# Patient Record
Sex: Male | Born: 1935 | Race: White | Hispanic: No | Marital: Married
Health system: Southern US, Community
[De-identification: ages and names within clinical notes are randomized; demographics above are authoritative.]

---

## 2012-05-20 ENCOUNTER — Ambulatory Visit: Payer: Self-pay | Admitting: Internal Medicine

## 2012-05-21 LAB — HEPATIC FUNCTION PANEL A (ARMC)
Albumin: 3.7 g/dL (ref 3.4–5.0)
Bilirubin, Direct: 0.1 mg/dL (ref 0.00–0.20)
Bilirubin,Total: 0.5 mg/dL (ref 0.2–1.0)
SGOT(AST): 23 U/L (ref 15–37)
Total Protein: 7.3 g/dL (ref 6.4–8.2)

## 2012-05-21 LAB — CBC CANCER CENTER
Basophil #: 0 x10 3/mm (ref 0.0–0.1)
Eosinophil #: 0.1 x10 3/mm (ref 0.0–0.7)
HCT: 37.9 % — ABNORMAL LOW (ref 40.0–52.0)
HGB: 12.9 g/dL — ABNORMAL LOW (ref 13.0–18.0)
Lymphocyte #: 1.3 x10 3/mm (ref 1.0–3.6)
MCHC: 34 g/dL (ref 32.0–36.0)
MCV: 91 fL (ref 80–100)
Monocyte #: 0.7 x10 3/mm (ref 0.2–1.0)
Monocyte %: 11.9 %
Neutrophil %: 60.7 %
Platelet: 128 x10 3/mm — ABNORMAL LOW (ref 150–440)
RDW: 13.1 % (ref 11.5–14.5)

## 2012-05-21 LAB — IRON AND TIBC: Iron Bind.Cap.(Total): 251 ug/dL (ref 250–450)

## 2012-05-21 LAB — FERRITIN: Ferritin (ARMC): 152 ng/mL (ref 8–388)

## 2012-05-21 LAB — URIC ACID: Uric Acid: 5.7 mg/dL (ref 3.5–7.2)

## 2012-05-24 LAB — URINE IEP, RANDOM

## 2012-05-24 LAB — PROT IMMUNOELECTROPHORES(ARMC)

## 2012-05-24 LAB — KAPPA/LAMBDA FREE LIGHT CHAINS (ARMC)

## 2012-06-05 ENCOUNTER — Ambulatory Visit: Payer: Self-pay | Admitting: Internal Medicine

## 2012-06-24 LAB — CBC CANCER CENTER
Basophil %: 0.7 %
Eosinophil #: 0.1 x10 3/mm (ref 0.0–0.7)
Eosinophil %: 2.1 %
Lymphocyte #: 1.3 x10 3/mm (ref 1.0–3.6)
Lymphocyte %: 23.2 %
MCH: 30.9 pg (ref 26.0–34.0)
MCHC: 33.8 g/dL (ref 32.0–36.0)
MCV: 91 fL (ref 80–100)
Monocyte %: 11.9 %
Platelet: 125 x10 3/mm — ABNORMAL LOW (ref 150–440)
RBC: 4.26 10*6/uL — ABNORMAL LOW (ref 4.40–5.90)
WBC: 5.6 x10 3/mm (ref 3.8–10.6)

## 2012-07-03 ENCOUNTER — Ambulatory Visit: Payer: Self-pay | Admitting: Internal Medicine

## 2012-07-16 ENCOUNTER — Ambulatory Visit: Payer: Self-pay | Admitting: Internal Medicine

## 2012-07-23 LAB — CBC CANCER CENTER
Basophil #: 0 x10 3/mm (ref 0.0–0.1)
Basophil %: 0.8 %
Eosinophil #: 0.1 x10 3/mm (ref 0.0–0.7)
Eosinophil %: 2.6 %
HCT: 41.3 % (ref 40.0–52.0)
Lymphocyte #: 1.3 x10 3/mm (ref 1.0–3.6)
Lymphocyte %: 22.8 %
Monocyte #: 0.7 x10 3/mm (ref 0.2–1.0)
Neutrophil #: 3.5 x10 3/mm (ref 1.4–6.5)
RBC: 4.61 10*6/uL (ref 4.40–5.90)

## 2012-07-23 LAB — RENAL FUNCTION PANEL
BUN: 31 mg/dL — ABNORMAL HIGH (ref 7–18)
Co2: 33 mmol/L — ABNORMAL HIGH (ref 21–32)
EGFR (African American): 28 — ABNORMAL LOW
EGFR (Non-African Amer.): 24 — ABNORMAL LOW
Osmolality: 285 (ref 275–301)
Potassium: 4.3 mmol/L (ref 3.5–5.1)
Sodium: 140 mmol/L (ref 136–145)

## 2012-07-26 LAB — KAPPA/LAMBDA FREE LIGHT CHAINS (ARMC)

## 2012-08-03 ENCOUNTER — Ambulatory Visit: Payer: Self-pay | Admitting: Internal Medicine

## 2012-08-19 LAB — CBC CANCER CENTER
Eosinophil %: 2.3 %
HCT: 41.6 % (ref 40.0–52.0)
Lymphocyte #: 1.3 x10 3/mm (ref 1.0–3.6)
MCH: 30.1 pg (ref 26.0–34.0)
MCHC: 33.9 g/dL (ref 32.0–36.0)
MCV: 89 fL (ref 80–100)
Monocyte #: 0.6 x10 3/mm (ref 0.2–1.0)
Platelet: 152 x10 3/mm (ref 150–440)

## 2012-09-02 ENCOUNTER — Ambulatory Visit: Payer: Self-pay | Admitting: Internal Medicine

## 2012-10-03 ENCOUNTER — Ambulatory Visit: Payer: Self-pay | Admitting: Internal Medicine

## 2012-10-20 LAB — CBC CANCER CENTER
Basophil #: 0.1 x10 3/mm (ref 0.0–0.1)
Basophil %: 1.2 %
HCT: 39.2 % — ABNORMAL LOW (ref 40.0–52.0)
HGB: 13.6 g/dL (ref 13.0–18.0)
Lymphocyte #: 1.3 x10 3/mm (ref 1.0–3.6)
Lymphocyte %: 22.3 %
MCH: 31.4 pg (ref 26.0–34.0)
MCHC: 34.7 g/dL (ref 32.0–36.0)
MCV: 90 fL (ref 80–100)
Monocyte %: 11 %
Neutrophil #: 3.6 x10 3/mm (ref 1.4–6.5)
Platelet: 153 x10 3/mm (ref 150–440)
RDW: 13.5 % (ref 11.5–14.5)

## 2012-10-20 LAB — CREATININE, SERUM
Creatinine: 2.11 mg/dL — ABNORMAL HIGH (ref 0.60–1.30)
EGFR (African American): 31 — ABNORMAL LOW
EGFR (Non-African Amer.): 27 — ABNORMAL LOW

## 2012-10-21 LAB — KAPPA/LAMBDA FREE LIGHT CHAINS (ARMC)

## 2012-11-02 ENCOUNTER — Ambulatory Visit: Payer: Self-pay | Admitting: Internal Medicine

## 2013-01-18 ENCOUNTER — Ambulatory Visit: Payer: Self-pay | Admitting: Internal Medicine

## 2013-01-19 LAB — CBC CANCER CENTER
Basophil #: 0 x10 3/mm (ref 0.0–0.1)
Basophil %: 0.8 %
Eosinophil #: 0.1 x10 3/mm (ref 0.0–0.7)
Lymphocyte #: 1.3 x10 3/mm (ref 1.0–3.6)
MCHC: 33.8 g/dL (ref 32.0–36.0)
MCV: 91 fL (ref 80–100)
Monocyte #: 0.7 x10 3/mm (ref 0.2–1.0)
Neutrophil #: 2.9 x10 3/mm (ref 1.4–6.5)
Neutrophil %: 57 %
RDW: 12.8 % (ref 11.5–14.5)
WBC: 5.1 x10 3/mm (ref 3.8–10.6)

## 2013-01-19 LAB — CREATININE, SERUM
Creatinine: 2.15 mg/dL — ABNORMAL HIGH (ref 0.60–1.30)
EGFR (African American): 30 — ABNORMAL LOW

## 2013-01-21 LAB — KAPPA/LAMBDA FREE LIGHT CHAINS (ARMC)

## 2013-02-02 ENCOUNTER — Ambulatory Visit: Payer: Self-pay | Admitting: Internal Medicine

## 2013-04-20 ENCOUNTER — Ambulatory Visit: Payer: Self-pay | Admitting: Internal Medicine

## 2013-04-20 LAB — CBC CANCER CENTER
Basophil #: 0 x10 3/mm (ref 0.0–0.1)
Basophil %: 0.8 %
Eosinophil #: 0.1 x10 3/mm (ref 0.0–0.7)
HCT: 41.2 % (ref 40.0–52.0)
HGB: 13.8 g/dL (ref 13.0–18.0)
Lymphocyte #: 1.2 x10 3/mm (ref 1.0–3.6)
MCH: 30.5 pg (ref 26.0–34.0)
MCHC: 33.4 g/dL (ref 32.0–36.0)
MCV: 91 fL (ref 80–100)
Monocyte #: 0.7 x10 3/mm (ref 0.2–1.0)
Monocyte %: 12.9 %
Neutrophil #: 3.5 x10 3/mm (ref 1.4–6.5)
Neutrophil %: 62.2 %
WBC: 5.6 x10 3/mm (ref 3.8–10.6)

## 2013-04-20 LAB — CREATININE, SERUM: EGFR (Non-African Amer.): 26 — ABNORMAL LOW

## 2013-04-25 LAB — PROT IMMUNOELECTROPHORES(ARMC)

## 2013-05-05 ENCOUNTER — Ambulatory Visit: Payer: Self-pay | Admitting: Internal Medicine

## 2013-07-21 ENCOUNTER — Emergency Department: Payer: Self-pay | Admitting: Emergency Medicine

## 2013-07-26 ENCOUNTER — Ambulatory Visit: Payer: Self-pay | Admitting: Internal Medicine

## 2013-07-27 LAB — CBC CANCER CENTER
BASOS PCT: 0.6 %
Basophil #: 0 x10 3/mm (ref 0.0–0.1)
EOS ABS: 0.2 x10 3/mm (ref 0.0–0.7)
EOS PCT: 3.4 %
HCT: 42.2 % (ref 40.0–52.0)
HGB: 13.9 g/dL (ref 13.0–18.0)
LYMPHS PCT: 24.5 %
Lymphocyte #: 1.3 x10 3/mm (ref 1.0–3.6)
MCH: 30.2 pg (ref 26.0–34.0)
MCHC: 32.9 g/dL (ref 32.0–36.0)
MCV: 92 fL (ref 80–100)
Monocyte #: 0.7 x10 3/mm (ref 0.2–1.0)
Monocyte %: 12.5 %
Neutrophil #: 3.1 x10 3/mm (ref 1.4–6.5)
Neutrophil %: 59 %
PLATELETS: 168 x10 3/mm (ref 150–440)
RBC: 4.59 10*6/uL (ref 4.40–5.90)
RDW: 13.2 % (ref 11.5–14.5)
WBC: 5.3 x10 3/mm (ref 3.8–10.6)

## 2013-07-27 LAB — CREATININE, SERUM
CREATININE: 2.12 mg/dL — AB (ref 0.60–1.30)
GFR CALC AF AMER: 31 — AB
GFR CALC NON AF AMER: 26 — AB

## 2013-07-28 LAB — KAPPA/LAMBDA FREE LIGHT CHAINS (ARMC)

## 2013-07-28 LAB — PROT IMMUNOELECTROPHORES(ARMC)

## 2013-08-03 ENCOUNTER — Ambulatory Visit: Payer: Self-pay | Admitting: Internal Medicine

## 2013-08-09 ENCOUNTER — Ambulatory Visit: Payer: Self-pay | Admitting: Otolaryngology

## 2013-09-02 ENCOUNTER — Ambulatory Visit: Payer: Self-pay | Admitting: Internal Medicine

## 2013-09-05 ENCOUNTER — Encounter: Payer: Self-pay | Admitting: Neurology

## 2013-10-26 ENCOUNTER — Ambulatory Visit: Payer: Self-pay | Admitting: Internal Medicine

## 2013-10-26 LAB — CREATININE, SERUM
Creatinine: 2.01 mg/dL — ABNORMAL HIGH (ref 0.60–1.30)
EGFR (African American): 33 — ABNORMAL LOW
EGFR (Non-African Amer.): 28 — ABNORMAL LOW

## 2013-10-26 LAB — CBC CANCER CENTER
Basophil #: 0 x10 3/mm (ref 0.0–0.1)
Basophil %: 0.7 %
EOS ABS: 0.1 x10 3/mm (ref 0.0–0.7)
Eosinophil %: 1.2 %
HCT: 39.8 % — AB (ref 40.0–52.0)
HGB: 13.6 g/dL (ref 13.0–18.0)
Lymphocyte #: 1 x10 3/mm (ref 1.0–3.6)
Lymphocyte %: 18.1 %
MCH: 31.1 pg (ref 26.0–34.0)
MCHC: 34.2 g/dL (ref 32.0–36.0)
MCV: 91 fL (ref 80–100)
Monocyte #: 0.6 x10 3/mm (ref 0.2–1.0)
Monocyte %: 10.8 %
Neutrophil #: 3.9 x10 3/mm (ref 1.4–6.5)
Neutrophil %: 69.2 %
Platelet: 159 x10 3/mm (ref 150–440)
RBC: 4.38 10*6/uL — AB (ref 4.40–5.90)
RDW: 13.3 % (ref 11.5–14.5)
WBC: 5.7 x10 3/mm (ref 3.8–10.6)

## 2013-10-28 LAB — PROT IMMUNOELECTROPHORES(ARMC)

## 2013-10-28 LAB — KAPPA/LAMBDA FREE LIGHT CHAINS (ARMC)

## 2013-11-14 LAB — CK TOTAL AND CKMB (NOT AT ARMC)
CK, TOTAL: 90 U/L
CK, Total: 132 U/L
CK-MB: 2.9 ng/mL (ref 0.5–3.6)
CK-MB: 9.2 ng/mL — ABNORMAL HIGH (ref 0.5–3.6)

## 2013-11-14 LAB — URINALYSIS, COMPLETE
BILIRUBIN, UR: NEGATIVE
BLOOD: NEGATIVE
Bacteria: NONE SEEN
GLUCOSE, UR: NEGATIVE mg/dL (ref 0–75)
Ketone: NEGATIVE
LEUKOCYTE ESTERASE: NEGATIVE
Nitrite: NEGATIVE
PROTEIN: NEGATIVE
Ph: 6 (ref 4.5–8.0)
RBC,UR: 1 /HPF (ref 0–5)
SQUAMOUS EPITHELIAL: NONE SEEN
Specific Gravity: 1.011 (ref 1.003–1.030)

## 2013-11-14 LAB — COMPREHENSIVE METABOLIC PANEL
ALK PHOS: 42 U/L — AB
ALT: 17 U/L (ref 12–78)
Albumin: 3.4 g/dL (ref 3.4–5.0)
Anion Gap: 6 — ABNORMAL LOW (ref 7–16)
BUN: 30 mg/dL — ABNORMAL HIGH (ref 7–18)
Bilirubin,Total: 0.3 mg/dL (ref 0.2–1.0)
CHLORIDE: 104 mmol/L (ref 98–107)
CO2: 29 mmol/L (ref 21–32)
Calcium, Total: 8.6 mg/dL (ref 8.5–10.1)
Creatinine: 2.2 mg/dL — ABNORMAL HIGH (ref 0.60–1.30)
EGFR (Non-African Amer.): 25 — ABNORMAL LOW
GFR CALC AF AMER: 29 — AB
GLUCOSE: 77 mg/dL (ref 65–99)
Osmolality: 283 (ref 275–301)
POTASSIUM: 3.8 mmol/L (ref 3.5–5.1)
SGOT(AST): 20 U/L (ref 15–37)
SODIUM: 139 mmol/L (ref 136–145)
Total Protein: 7 g/dL (ref 6.4–8.2)

## 2013-11-14 LAB — CBC WITH DIFFERENTIAL/PLATELET
Basophil #: 0 10*3/uL (ref 0.0–0.1)
Basophil %: 0.5 %
Eosinophil #: 0.1 10*3/uL (ref 0.0–0.7)
Eosinophil %: 1.4 %
HCT: 39.3 % — ABNORMAL LOW (ref 40.0–52.0)
HGB: 12.9 g/dL — AB (ref 13.0–18.0)
LYMPHS ABS: 1.3 10*3/uL (ref 1.0–3.6)
LYMPHS PCT: 25.1 %
MCH: 30.9 pg (ref 26.0–34.0)
MCHC: 33 g/dL (ref 32.0–36.0)
MCV: 94 fL (ref 80–100)
Monocyte #: 0.7 x10 3/mm (ref 0.2–1.0)
Monocyte %: 13.2 %
Neutrophil #: 3.1 10*3/uL (ref 1.4–6.5)
Neutrophil %: 59.8 %
PLATELETS: 153 10*3/uL (ref 150–440)
RBC: 4.19 10*6/uL — ABNORMAL LOW (ref 4.40–5.90)
RDW: 13.3 % (ref 11.5–14.5)
WBC: 5.1 10*3/uL (ref 3.8–10.6)

## 2013-11-14 LAB — TROPONIN I
TROPONIN-I: 0.11 ng/mL — AB
TROPONIN-I: 3.9 ng/mL — AB

## 2013-11-15 ENCOUNTER — Inpatient Hospital Stay: Payer: Self-pay | Admitting: Family Medicine

## 2013-11-15 LAB — PROTIME-INR
INR: 1.1
Prothrombin Time: 13.9 secs (ref 11.5–14.7)

## 2013-11-15 LAB — BASIC METABOLIC PANEL
ANION GAP: 6 — AB (ref 7–16)
BUN: 28 mg/dL — AB (ref 7–18)
CHLORIDE: 111 mmol/L — AB (ref 98–107)
Calcium, Total: 8.3 mg/dL — ABNORMAL LOW (ref 8.5–10.1)
Co2: 24 mmol/L (ref 21–32)
Creatinine: 1.85 mg/dL — ABNORMAL HIGH (ref 0.60–1.30)
EGFR (African American): 36 — ABNORMAL LOW
GFR CALC NON AF AMER: 31 — AB
Glucose: 92 mg/dL (ref 65–99)
OSMOLALITY: 286 (ref 275–301)
Potassium: 4.1 mmol/L (ref 3.5–5.1)
SODIUM: 141 mmol/L (ref 136–145)

## 2013-11-15 LAB — TROPONIN I: TROPONIN-I: 5.1 ng/mL — AB

## 2013-11-15 LAB — CK TOTAL AND CKMB (NOT AT ARMC)
CK, Total: 115 U/L
CK-MB: 9.8 ng/mL — AB (ref 0.5–3.6)

## 2013-11-15 LAB — CBC WITH DIFFERENTIAL/PLATELET
BASOS ABS: 0 10*3/uL (ref 0.0–0.1)
Basophil %: 0.9 %
Eosinophil #: 0.1 10*3/uL (ref 0.0–0.7)
Eosinophil %: 1.9 %
HCT: 37 % — ABNORMAL LOW (ref 40.0–52.0)
HGB: 12.2 g/dL — ABNORMAL LOW (ref 13.0–18.0)
LYMPHS ABS: 1.6 10*3/uL (ref 1.0–3.6)
LYMPHS PCT: 29.2 %
MCH: 30.9 pg (ref 26.0–34.0)
MCHC: 32.9 g/dL (ref 32.0–36.0)
MCV: 94 fL (ref 80–100)
Monocyte #: 0.6 x10 3/mm (ref 0.2–1.0)
Monocyte %: 10.7 %
Neutrophil #: 3.1 10*3/uL (ref 1.4–6.5)
Neutrophil %: 57.3 %
PLATELETS: 140 10*3/uL — AB (ref 150–440)
RBC: 3.93 10*6/uL — ABNORMAL LOW (ref 4.40–5.90)
RDW: 13.3 % (ref 11.5–14.5)
WBC: 5.4 10*3/uL (ref 3.8–10.6)

## 2013-11-15 LAB — APTT: Activated PTT: >160 secs (ref 23.6–35.9)

## 2013-11-15 LAB — CK-MB: CK-MB: 8.5 ng/mL — ABNORMAL HIGH (ref 0.5–3.6)

## 2013-11-15 LAB — HEPARIN LEVEL (UNFRACTIONATED): Anti-Xa(Unfractionated): 0.61 IU/mL (ref 0.30–0.70)

## 2013-11-16 LAB — CBC WITH DIFFERENTIAL/PLATELET
Basophil #: 0 10*3/uL (ref 0.0–0.1)
Basophil %: 0.7 %
Eosinophil #: 0.2 10*3/uL (ref 0.0–0.7)
Eosinophil %: 2.9 %
HCT: 37.5 % — ABNORMAL LOW (ref 40.0–52.0)
HGB: 12.6 g/dL — AB (ref 13.0–18.0)
LYMPHS ABS: 1.6 10*3/uL (ref 1.0–3.6)
Lymphocyte %: 27.1 %
MCH: 31.3 pg (ref 26.0–34.0)
MCHC: 33.7 g/dL (ref 32.0–36.0)
MCV: 93 fL (ref 80–100)
Monocyte #: 0.7 x10 3/mm (ref 0.2–1.0)
Monocyte %: 11.4 %
NEUTROS ABS: 3.4 10*3/uL (ref 1.4–6.5)
Neutrophil %: 57.9 %
Platelet: 147 10*3/uL — ABNORMAL LOW (ref 150–440)
RBC: 4.03 10*6/uL — ABNORMAL LOW (ref 4.40–5.90)
RDW: 13 % (ref 11.5–14.5)
WBC: 5.9 10*3/uL (ref 3.8–10.6)

## 2013-11-16 LAB — HEPARIN LEVEL (UNFRACTIONATED): Anti-Xa(Unfractionated): 0.21 IU/mL — ABNORMAL LOW (ref 0.30–0.70)

## 2013-11-19 ENCOUNTER — Emergency Department: Payer: Self-pay | Admitting: Emergency Medicine

## 2013-11-19 LAB — URINALYSIS, COMPLETE
Bilirubin,UR: NEGATIVE
GLUCOSE, UR: NEGATIVE mg/dL (ref 0–75)
KETONE: NEGATIVE
Leukocyte Esterase: NEGATIVE
Nitrite: NEGATIVE
Ph: 7 (ref 4.5–8.0)
Protein: NEGATIVE
SPECIFIC GRAVITY: 1.012 (ref 1.003–1.030)
SQUAMOUS EPITHELIAL: NONE SEEN
WBC UR: NONE SEEN /HPF (ref 0–5)

## 2013-11-19 LAB — CBC WITH DIFFERENTIAL/PLATELET
BASOS PCT: 0.6 %
Basophil #: 0 10*3/uL (ref 0.0–0.1)
EOS ABS: 0.1 10*3/uL (ref 0.0–0.7)
Eosinophil %: 1 %
HCT: 39.6 % — AB (ref 40.0–52.0)
HGB: 13.2 g/dL (ref 13.0–18.0)
LYMPHS PCT: 19.1 %
Lymphocyte #: 1.5 10*3/uL (ref 1.0–3.6)
MCH: 31.1 pg (ref 26.0–34.0)
MCHC: 33.3 g/dL (ref 32.0–36.0)
MCV: 93 fL (ref 80–100)
Monocyte #: 0.9 x10 3/mm (ref 0.2–1.0)
Monocyte %: 11.1 %
Neutrophil #: 5.5 10*3/uL (ref 1.4–6.5)
Neutrophil %: 68.2 %
Platelet: 167 10*3/uL (ref 150–440)
RBC: 4.24 10*6/uL — ABNORMAL LOW (ref 4.40–5.90)
RDW: 13.1 % (ref 11.5–14.5)
WBC: 8 10*3/uL (ref 3.8–10.6)

## 2013-11-19 LAB — COMPREHENSIVE METABOLIC PANEL
ALT: 26 U/L (ref 12–78)
AST: 29 U/L (ref 15–37)
Albumin: 3.7 g/dL (ref 3.4–5.0)
Alkaline Phosphatase: 41 U/L — ABNORMAL LOW
Anion Gap: 7 (ref 7–16)
BILIRUBIN TOTAL: 0.3 mg/dL (ref 0.2–1.0)
BUN: 33 mg/dL — ABNORMAL HIGH (ref 7–18)
CO2: 27 mmol/L (ref 21–32)
Calcium, Total: 9 mg/dL (ref 8.5–10.1)
Chloride: 99 mmol/L (ref 98–107)
Creatinine: 2.12 mg/dL — ABNORMAL HIGH (ref 0.60–1.30)
EGFR (African American): 31 — ABNORMAL LOW
GFR CALC NON AF AMER: 26 — AB
GLUCOSE: 101 mg/dL — AB (ref 65–99)
Osmolality: 274 (ref 275–301)
Potassium: 4.4 mmol/L (ref 3.5–5.1)
SODIUM: 133 mmol/L — AB (ref 136–145)
TOTAL PROTEIN: 7.5 g/dL (ref 6.4–8.2)

## 2013-11-21 ENCOUNTER — Ambulatory Visit: Payer: Self-pay | Admitting: Internal Medicine

## 2013-11-25 LAB — CULTURE, BLOOD (SINGLE)

## 2014-01-27 ENCOUNTER — Ambulatory Visit: Payer: Self-pay | Admitting: Internal Medicine

## 2014-01-27 LAB — CREATININE, SERUM
CREATININE: 2.35 mg/dL — AB (ref 0.60–1.30)
EGFR (Non-African Amer.): 28 — ABNORMAL LOW
GFR CALC AF AMER: 34 — AB

## 2014-02-02 ENCOUNTER — Ambulatory Visit: Payer: Self-pay | Admitting: Internal Medicine

## 2014-02-02 LAB — PROT IMMUNOELECTROPHORES(ARMC)

## 2014-02-02 LAB — KAPPA/LAMBDA FREE LIGHT CHAINS (ARMC)

## 2014-04-26 ENCOUNTER — Ambulatory Visit: Payer: Self-pay | Admitting: Internal Medicine

## 2014-04-26 LAB — CBC CANCER CENTER
BASOS ABS: 0 x10 3/mm (ref 0.0–0.1)
BASOS PCT: 0.8 %
Eosinophil #: 0.1 x10 3/mm (ref 0.0–0.7)
Eosinophil %: 1.7 %
HCT: 39.2 % — ABNORMAL LOW (ref 40.0–52.0)
HGB: 13 g/dL (ref 13.0–18.0)
LYMPHS ABS: 1 x10 3/mm (ref 1.0–3.6)
Lymphocyte %: 16.8 %
MCH: 30.4 pg (ref 26.0–34.0)
MCHC: 33.3 g/dL (ref 32.0–36.0)
MCV: 91 fL (ref 80–100)
MONO ABS: 0.6 x10 3/mm (ref 0.2–1.0)
Monocyte %: 10.7 %
NEUTROS ABS: 4.2 x10 3/mm (ref 1.4–6.5)
NEUTROS PCT: 70 %
Platelet: 169 x10 3/mm (ref 150–440)
RBC: 4.29 10*6/uL — ABNORMAL LOW (ref 4.40–5.90)
RDW: 13.1 % (ref 11.5–14.5)
WBC: 5.9 x10 3/mm (ref 3.8–10.6)

## 2014-04-26 LAB — CALCIUM: Calcium, Total: 9.1 mg/dL (ref 8.5–10.1)

## 2014-04-27 LAB — PROT IMMUNOELECTROPHORES(ARMC)

## 2014-04-27 LAB — KAPPA/LAMBDA FREE LIGHT CHAINS (ARMC)

## 2014-05-05 ENCOUNTER — Ambulatory Visit: Payer: Self-pay | Admitting: Internal Medicine

## 2014-06-28 ENCOUNTER — Ambulatory Visit: Payer: Self-pay | Admitting: Internal Medicine

## 2014-07-04 ENCOUNTER — Ambulatory Visit
Admit: 2014-07-04 | Disposition: A | Discharge status: Home / Self Care | Payer: Self-pay | Attending: Internal Medicine | Admitting: Internal Medicine

## 2014-08-17 ENCOUNTER — Emergency Department
Admit: 2014-08-17 | Disposition: A | Discharge status: Other Acute Inpatient Hospital | Payer: Self-pay | Admitting: Emergency Medicine

## 2014-08-17 LAB — CK TOTAL AND CKMB (NOT AT ARMC)
CK, TOTAL: 88 U/L
CK-MB: 3.6 ng/mL

## 2014-08-17 LAB — COMPREHENSIVE METABOLIC PANEL
ALBUMIN: 4.1 g/dL
ALK PHOS: 32 U/L — AB
ALT: 12 U/L — AB
Anion Gap: 10 (ref 7–16)
BILIRUBIN TOTAL: 0.8 mg/dL
BUN: 30 mg/dL — AB
CO2: 24 mmol/L
Calcium, Total: 9.1 mg/dL
Chloride: 96 mmol/L — ABNORMAL LOW
Creatinine: 1.95 mg/dL — ABNORMAL HIGH
EGFR (African American): 34 — ABNORMAL LOW
EGFR (Non-African Amer.): 29 — ABNORMAL LOW
GLUCOSE: 153 mg/dL — AB
Potassium: 4.1 mmol/L
SGOT(AST): 31 U/L
Sodium: 130 mmol/L — ABNORMAL LOW
Total Protein: 7 g/dL

## 2014-08-17 LAB — URINALYSIS, COMPLETE
Bacteria: NONE SEEN
Bilirubin,UR: NEGATIVE
Blood: NEGATIVE
Glucose,UR: NEGATIVE mg/dL (ref 0–75)
Ketone: NEGATIVE
LEUKOCYTE ESTERASE: NEGATIVE
NITRITE: NEGATIVE
PH: 7 (ref 4.5–8.0)
RBC, UR: NONE SEEN /HPF (ref 0–5)
Specific Gravity: 1.01 (ref 1.003–1.030)
Squamous Epithelial: NONE SEEN

## 2014-08-17 LAB — PROTIME-INR
INR: 1
Prothrombin Time: 13.3 secs

## 2014-08-17 LAB — CBC
HCT: 39.1 % — AB (ref 40.0–52.0)
HGB: 12.8 g/dL — ABNORMAL LOW (ref 13.0–18.0)
MCH: 29.9 pg (ref 26.0–34.0)
MCHC: 32.6 g/dL (ref 32.0–36.0)
MCV: 92 fL (ref 80–100)
Platelet: 162 10*3/uL (ref 150–440)
RBC: 4.26 10*6/uL — ABNORMAL LOW (ref 4.40–5.90)
RDW: 12.8 % (ref 11.5–14.5)
WBC: 8.8 10*3/uL (ref 3.8–10.6)

## 2014-08-17 LAB — APTT: Activated PTT: 25.8 secs (ref 23.6–35.9)

## 2014-08-17 LAB — TROPONIN I: Troponin-I: 0.03 ng/mL

## 2014-08-26 NOTE — Consult Note (Signed)
PATIENT NAME:  Jared Mclean, Jared MR#:  [id number] DATE OF BIRTH:  Dec 29, 1919  DATE OF CONSULTATION:  11/15/2013  REFERRING PHYSICIAN:  Dr. Burnadette PopLinthavong  CONSULTING PHYSICIAN:  Lamar BlinksBruce J. Alethea Terhaar, MD  REASON FOR CONSULTATION: Acute subendocardial myocardial infarction.   CHIEF COMPLAINT: "My legs gave out."  HISTORY OF PRESENT ILLNESS: This is a 81 year old male with known cardiovascular risk factors, chronic kidney disease and valvular heart disease who has had acute onset of weakness, fatigue, and legs giving out, but no evidence of true chest discomfort. When seen in the Emergency Room he was given the appropriate medications and he is feeling much better, less weakness and fatigue. EKG shows normal sinus rhythm, left axis deviation and right bundle branch block with elevated troponin of 5.1 consistent myocardial infarction. Since then he has been appropriately treated with heparin and nitroglycerin and doing better.   REVIEW OF SYSTEMS: The remainder is negative vision change, ringing in the ears, hearing loss, cough, congestion, heartburn, nausea, vomiting, diarrhea, bloody stool, stomach pain, extremity pain, leg weakness, cramping of the buttocks, known blood clots, headaches, blackouts, dizzy spells, nosebleeds, congestion, trouble swallowing, frequent urination, urination at night, muscle weakness, numbness, anxiety, depression, skin lesions or skin rashes.   PAST MEDICAL HISTORY: Chronic kidney disease.   SOCIAL HISTORY: Currently denies alcohol or tobacco use.   DICTATION ENDS HERE continue later  ____________________________ Lamar BlinksBruce J. Stehanie Ekstrom, MD bjk:sb D: 11/15/2013 10:20:00 ET T: 11/15/2013 11:05:46 ET JOB#: [id number]  cc: Lamar BlinksBruce J. Malli Falotico, MD, <Dictator> Lamar BlinksBRUCE J Cassia Fein MD ELECTRONICALLY SIGNED 11/18/2013 8:20

## 2014-08-26 NOTE — Consult Note (Signed)
PATIENT NAME:  Jared Mclean, Salam MR#:  [id number] DATE OF BIRTH:  08-Aug-1919  DATE OF CONSULTATION:  11/15/2013.  Addendum. This is a continuation.  PHYSICAL EXAMINATION: GENERAL:  The patient is a well-appearing male in no acute distress.  HEENT: No icterus, thyromegaly, ulcers, hemorrhage, or xanthelasma.  CARDIOVASCULAR: Regular rate and rhythm. Normal S1 and S2. A 2-3/6 right upper sternal border murmur radiating through the carotids. Carotid upstroke normal with murmur radiation, jugular venous pressure is normal.  LUNGS: Have few basilar crackles with normal respirations.  ABDOMEN: Soft, nontender, without hepatosplenomegaly or masses. Abdominal aorta is normal in size without bruit.  EXTREMITIES: 2+ radial, femoral, dorsal pedal pulses, with no lower extremity edema, cyanosis, clubbing or ulcers.  NEUROLOGIC: He is oriented to time, place, and person, with normal mood and affect.   ASSESSMENT: A 81 year old male with acute subendocardial myocardial infarction, chronic kidney disease, abnormal EKG, needing further treatment options.   RECOMMENDATIONS:  1.  Heparin for further risk reduction in cardiovascular event and/or myocardial infarction.  2.  Echocardiogram for valvular heart disease and myocardial infarction.  3.  Further consideration of medication management including nitrates, beta blocker and ACE inhibitor for myocardial infarction.  4.  Ambulation and followup for any further significant symptoms and possible cardiac catheterization if necessary.    ____________________________ Lamar BlinksBruce J. Alejos Reinhardt, MD bjk:lt D: 11/15/2013 10:22:17 ET T: 11/15/2013 11:45:19 ET JOB#: [id number]  cc: Lamar BlinksBruce J. Fransisco Messmer, MD, <Dictator> Lamar BlinksBRUCE J Adalai Perl MD ELECTRONICALLY SIGNED 11/18/2013 8:20

## 2014-08-26 NOTE — H&P (Signed)
PATIENT NAME:  Jared Mclean, Jared Mclean MR#:  [id number] DATE OF BIRTH:  01-17-1920  DATE OF ADMISSION:  11/15/2013  PRIMARY CARE PHYSICIAN:  Marisue Ivan, MD  CHIEF COMPLAINT:  Generalized weakness.    HISTORY OF PRESENT ILLNESS:  Mr. Jared Mclean is a 81 year old Caucasian gentleman with history of chronic kidney disease stage 3, creatinine baseline around 2.1, history of benign prostatic hypertrophy, borderline thrombocytopenia, comes to the Emergency Room from [facility] after he had a workout session this morning and thereafter felt very fatigued, tired and no energy.  The patient appeared to be mildly dehydrated during clinical evaluation in the Emergency Room.  He was also noted to have chronic kidney disease with creatinine of 2 which is his baseline.  His troponin was 0.11.  EKG showed bifascicular block.  The patient reports no history of coronary artery disease or any history of myocardial infarction in the past.  Given the above, hospitalist was involved in the patient's care.  He is being admitted for further evaluation and management.  The patient reports no shortness of breath, no chest pain.    PAST MEDICAL HISTORY:   1.  Chronic kidney disease stage 3, creatinine of 2.1. 2.  History of benign prostatic hypertrophy. 3.  Appendectomy.   4.  Cataract surgery. 5.  Mild hearing loss. 6.  Kidney stones. 7.  Perforated ulcer surgery many years ago. 8.  Hernia repair.    MEDICATIONS: Aspirin 81 mg daily.    FAMILY HISTORY:  Sister has stomach cancer.  Another sister has cervical cancer.  One brother has mesothelioma from asbestos exposure.    SOCIAL HISTORY:  Nonsmoker, nonalcholoic.  Lives with his [facility] at [facility].  He is otherwise active and works out on a daily basis.    ALLERGIES:  No known drug allergies.  REVIEW OF SYSTEMS: CONSTITUTIONAL:  Positive for fatigue and weakness.  No pain, weight loss or fever. EYES:  No blurred or double vision, glaucoma. EARS, NOSE AND THROAT:  No  tinnitus, ear pain, hearing loss or postnasal drip.   RESPIRATORY:  No cough, wheeze, hemoptysis or dyspnea or asthma. CARDIOVASCULAR:  No chest pain, orthopnea, edema, dyspnea on exertion or palpitations. GASTROINTESTINAL:  No nausea, vomiting, diarrhea, abdominal pain.  No hematemesis or gastroesophageal reflux disease. GENITOURINARY:  No dysuria, hematuria, frequency. ENDOCRINE:  No polyuria, nocturia or thyroid problems. HEMATOLOGY:  No anemia or easy bruising or bleeding. SKIN:  No acne, rash or lesion. MUSCULOSKELETAL:  No arthritis.  Positive for some leg weakness.   NEUROLOGICAL:  Weakness in legs.  No cerebrovascular accident, transient ischemic attack, seizures or dementia.   PSYCHIATRIC:  No anxiety or depression. All other systems reviewed and negative.  PHYSICAL EXAMINATION: GENERAL:  The patient is awake, alert, oriented x 3, not in acute distress, afebrile. VITAL SIGNS:  Pulse is 76, blood pressure is 141/66, pulse oximetry is 99% on room air. HEENT:  Atraumatic, normocephalic.  PERRLA, EOM intact.  Oral mucosa is dry. NECK:  Supple, no JVD, no carotid bruit. RESPIRATORY:  Clear to auscultation bilaterally.  No rales, rhonchi, respiratory distress or labored breathing. CARDIOVASCULAR:  Both heart sounds are normal.  Rate and rhythm regular.  PMI not lateralized.  Chest is nontender.   EXTREMITIES:  Good pedal pulse, good femoral pulses.  No lower extremity edema. ABDOMEN:  Soft, benign, nontender, no organomegaly.  Positive bowel sounds. NEUROLOGICAL:  Grossly intact cranial nerves II through XII.  No motor or sensory deficit. PSYCHIATRIC:  The patient is awake, alert,  oriented x 3.   SKIN:  Warm and dry.    LABORATORY, DIAGNOSTIC AND RADIOLOGICAL DATA:   1.  EKG shows right bundle branch block, normal sinus rhythm, left posterior fascicular block; no old EKG for comparison.   2.  Creatinine is 2.20, BUN is 30, the rest of the basic metabolic panel within normal limits.    3.  CK and CK-MB within normal limits, troponin is 0.11.   4.  CBC within normal limits.   5.  UA negative for urinary tract infection.    ASSESSMENT AND PLAN:  A 81 year old, Mr. Jared Mclean, with history of chronic kidney disease stage 3, nephrolithiasis in the past, comes in with generalized weakness and low in energy.  He is being admitted with: 1.  Generalized weakness, easy fatigability and suspected due to mild dehydration clinically.  The patient has been working out lately, however, he seems not taking much oral intake.  Will admit the patient for overnight observation, give IV fluids.  The patient encouraged to drink fluids during his workouts.  Will monitor his vital signs which appear to relatively stable at this time.   2.  Elevated troponin, borderline, without any history of chest pain or history of coronary artery disease; however, EKG shows bifascicular block.  This likely is due to chronic kidney disease stage 3; however, will monitor cardiac enzymes x 3.  Continue the patient's aspirin.  He denies any chest pain or shortness of breath.  Will hold off on cardiology consultation unless his troponin continues to rise.   3.  Chronic kidney disease stage 3.  The patient's creatinine is stable at 2.  Will give IV fluids for hydration since the patient clinically appears dehydrated.   4.  Deep venous thrombosis prophylaxis, subcutaneous heparin. 5.  Physical therapy to see the patient for gait, ambulation and training.   6.  Care management for discharge planning.   7.  CODE STATUS:  No code, DO NOT RESUSCITATE.  The patient carries an out-of-facility form.    TIME SPENT:  50 minutes.    ____________________________ Wylie HailSona A. Allena KatzPatel, MD sap:cs D: 11/14/2013 18:18:00 ET T: 11/14/2013 19:13:49 ET JOB#: [id number]  cc: Marisue IvanKanhka Linthavong, MD Leondre Taul A. Allena KatzPatel, MD, <Dictator>   Willow OraSONA A Con Arganbright MD ELECTRONICALLY SIGNED 11/29/2013 15:35

## 2014-08-26 NOTE — H&P (Signed)
PATIENT NAME:  Jared Mclean, DOCKTER MR#:  [id number] DATE OF BIRTH:  31-Jul-1919  DATE OF ADMISSION:  11/14/2013  PRIMARY CARE PHYSICIAN:  Marisue Ivan, MD  CHIEF COMPLAINT:  Generalized weakness.    HISTORY OF PRESENT ILLNESS:  Mr. Buice is a 81 year old Caucasian gentleman with history of chronic kidney disease stage 3, creatinine baseline around 2.1, history of benign prostatic hypertrophy, borderline thrombocytopenia, comes to the Emergency Room from [facility] after he had a workout session this morning and thereafter felt very fatigued, tired and no energy.  The patient appeared to be mildly dehydrated during clinical evaluation in the Emergency Room.  He was also noted to have chronic kidney disease with creatinine of 2 which is his baseline.  His troponin was 0.11.  EKG showed bifascicular block.  The patient reports no history of coronary artery disease or any history of myocardial infarction in the past.  Given the above, hospitalist was involved in the patient's care.  He is being admitted for further evaluation and management.  The patient reports no shortness of breath, no chest pain.    PAST MEDICAL HISTORY:   1.  Chronic kidney disease stage 3, creatinine of 2.1. 2.  History of benign prostatic hypertrophy. 3.  Appendectomy.   4.  Cataract surgery. 5.  Mild hearing loss. 6.  Kidney stones. 7.  Perforated ulcer surgery many years ago. 8.  Hernia repair.    MEDICATIONS: Aspirin 81 mg daily.    FAMILY HISTORY:  Sister has stomach cancer.  Another sister has cervical cancer.  One brother has mesothelioma from asbestos exposure.    SOCIAL HISTORY:  Nonsmoker, nonalcholoic.  Lives with his wife at [facility].  He is otherwise active and works out on a daily basis.    ALLERGIES:  No known drug allergies.  REVIEW OF SYSTEMS: CONSTITUTIONAL:  Positive for fatigue and weakness.  No pain, weight loss or fever. EYES:  No blurred or double vision, glaucoma. EARS, NOSE AND THROAT:  No  tinnitus, ear pain, hearing loss or postnasal drip.   RESPIRATORY:  No cough, wheeze, hemoptysis or dyspnea or asthma. CARDIOVASCULAR:  No chest pain, orthopnea, edema, dyspnea on exertion or palpitations. GASTROINTESTINAL:  No nausea, vomiting, diarrhea, abdominal pain.  No hematemesis or gastroesophageal reflux disease. GENITOURINARY:  No dysuria, hematuria, frequency. ENDOCRINE:  No polyuria, nocturia or thyroid problems. HEMATOLOGY:  No anemia or easy bruising or bleeding. SKIN:  No acne, rash or lesion. MUSCULOSKELETAL:  No arthritis.  Positive for some leg weakness.   NEUROLOGICAL:  Weakness in legs.  No cerebrovascular accident, transient ischemic attack, seizures or dementia.   PSYCHIATRIC:  No anxiety or depression. All other systems reviewed and negative.  PHYSICAL EXAMINATION: GENERAL:  The patient is awake, alert, oriented x 3, not in acute distress, afebrile. VITAL SIGNS:  Pulse is 76, blood pressure is 141/66, pulse oximetry is 99% on room air. HEENT:  Atraumatic, normocephalic.  PERRLA, EOM intact.  Oral mucosa is dry. NECK:  Supple, no JVD, no carotid bruit. RESPIRATORY:  Clear to auscultation bilaterally.  No rales, rhonchi, respiratory distress or labored breathing. CARDIOVASCULAR:  Both heart sounds are normal.  Rate and rhythm regular.  PMI not lateralized.  Chest is nontender.   EXTREMITIES:  Good pedal pulse, good femoral pulses.  No lower extremity edema. ABDOMEN:  Soft, benign, nontender, no organomegaly.  Positive bowel sounds. NEUROLOGICAL:  Grossly intact cranial nerves II through XII.  No motor or sensory deficit. PSYCHIATRIC:  The patient is awake, alert,  oriented x 3.   SKIN:  Warm and dry.    LABORATORY, DIAGNOSTIC AND RADIOLOGICAL DATA:   1.  EKG shows right bundle branch block, normal sinus rhythm, left posterior fascicular block; no old EKG for comparison.   2.  Creatinine is 2.20, BUN is 30, the rest of the basic metabolic panel within normal limits.    3.  CK and CK-MB within normal limits, troponin is 0.11.   4.  CBC within normal limits.   5.  UA negative for urinary tract infection.    ASSESSMENT AND PLAN:  A 81 year old, Mr. [name], with history of chronic kidney disease stage 3, nephrolithiasis in the past, comes in with generalized weakness and low in energy.  He is being admitted with: 1.  Generalized weakness, easy fatigability and suspected due to mild dehydration clinically.  The patient has been working out lately, however, he seems not taking much oral intake.  Will admit the patient for overnight observation, give IV fluids.  The patient encouraged to drink fluids during his workouts.  Will monitor his vital signs which appear to relatively stable at this time.   2.  Elevated troponin, borderline, without any history of chest pain or history of coronary artery disease; however, EKG shows bifascicular block.  This likely is due to chronic kidney disease stage 3; however, will monitor cardiac enzymes x 3.  Continue the patient's aspirin.  He denies any chest pain or shortness of breath.  Will hold off on cardiology consultation unless his troponin continues to rise.   3.  Chronic kidney disease stage 3.  The patient's creatinine is stable at 2.  Will give IV fluids for hydration since the patient clinically appears dehydrated.   4.  Deep venous thrombosis prophylaxis, subcutaneous heparin. 5.  Physical therapy to see the patient for gait, ambulation and training.   6.  Care management for discharge planning.   7.  CODE STATUS:  No code, DO NOT RESUSCITATE.  The patient carries an out-of-facility form.    TIME SPENT:  50 minutes.    ____________________________ Velva HarmanMohammed A. Clydie BraunBhatti, MD mab:cs D: 11/14/2013 18:18:55 ET T: 11/14/2013 19:13:49 ET JOB#: [id number]  cc: Mohammed A. Clydie BraunBhatti, MD, <Dictator> Marisue IvanKanhka Linthavong, MD

## 2014-08-26 NOTE — Discharge Summary (Signed)
PATIENT NAME:  Jared Mclean, [name] MR#:  [id number] DATE OF BIRTH:  May 31, 1919  DATE OF ADMISSION:  11/15/2013 DATE OF DISCHARGE:  11/16/2013  DISCHARGE DIAGNOSES:  1.  Acute subendocardial myocardial infarction.  2.  Chronic kidney disease.  3.  Mild dehydration.   DISCHARGE MEDICATIONS:  1.  Aspirin 325 mg p.o. daily.  2.  Losartan 25 mg p.o. daily.  3.  Pravastatin 10 mg p.o. at bedtime.  4.  Clopidogrel 75 mg p.o. daily.  5.  Metoprolol succinate 25 mg p.o. daily.   CONSULTS: Cardiology per Dr. Gwen PoundsKowalski.   PROCEDURES: None.   PERTINENT LABORATORY AND STUDIES: On day of discharge, white blood cell count 5.9, hemoglobin 12.6, platelets 147,000. CK-MB 9.2, 9.8, 8.5, troponin 0.11, 3.9, 5.1. Sodium 141, potassium 4.1, creatinine 1.85 glucose of 92.   BRIEF HOSPITAL COURSE:  1.  Acute subendocardial MI. The patient initially came in with atypical presentation of lower extremity weakness. Upon admission, he was noted to have no acute ST or T wave changes or chest discomfort, but found to have elevated cardiac enzymes that continued to trend upward and remained asymptomatic. He was placed on a heparin drip and cardiology was consulted. Echo was performed which showed no wall abnormality. It did show mild to moderate aortic stenosis with LV wall thickening; otherwise, stable. Patient was able to ambulate without difficulty. Plan is to do medical management at this time. He was placed on a beta blocker, ARB, aspirin, Plavix, and a statin. Will follow with Dr. Gwen PoundsKowalski as an outpatient for further stress testing. Will likely need cardiac rehabilitation as an outpatient as well. Will follow up with Dr. Burnadette PopLinthavong within a week.   2.  Other chronic issues remain stable at this time.   DISPOSITION: He is in stable condition.  Follow-up as directed above.    ____________________________ Marisue IvanKanhka Carolee Channell, MD kl:ts D: 11/16/2013 08:17:44 ET T: 11/16/2013 11:14:11 ET JOB#: [id number]  cc: Marisue IvanKanhka  Adarryl Goldammer, MD, <Dictator> Marisue IvanKANHKA Anmol Paschen MD ELECTRONICALLY SIGNED 11/27/2013 8:06

## 2014-09-03 DEATH — deceased

## 2014-09-07 IMAGING — CT CT HEAD WITHOUT CONTRAST
1 series · 15 of 30 positions shown, 19 images · non-contrast
Comparison: None.

CLINICAL DATA: Severe headache since yesterday.

EXAM:
CT HEAD WITHOUT CONTRAST
TECHNIQUE: Contiguous axial images were obtained from the base of the skull
through the vertex without intravenous contrast.

[Series 2: head wo · axial · 0.41mm/px · z∈[+679,+814]mm · 15 of 34 slices shown, 19 images]
[im 2/34  brain]
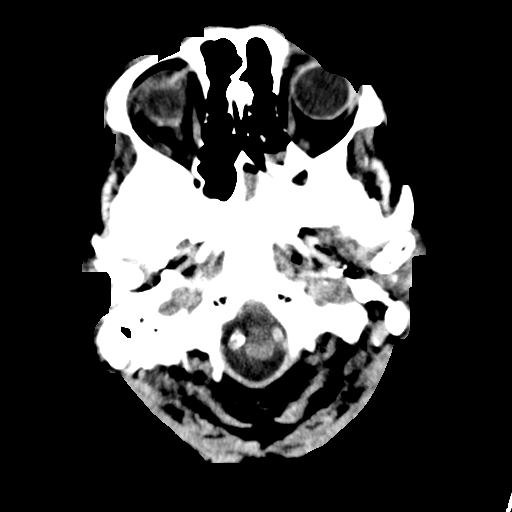
[im 2/34  bone]
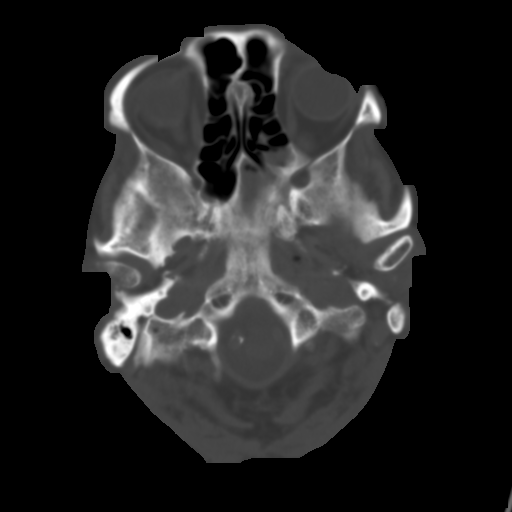
[im 4/34  brain]
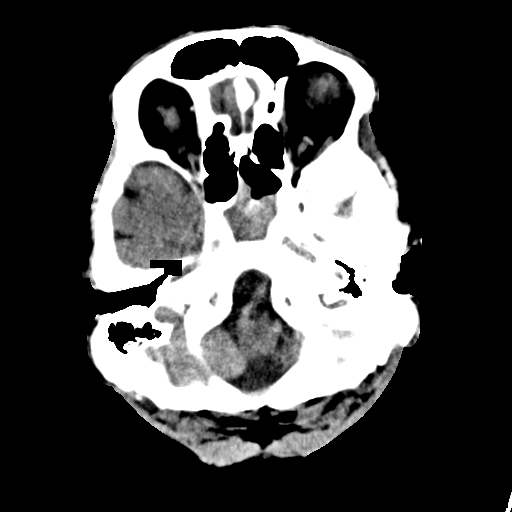
[im 6/34  brain]
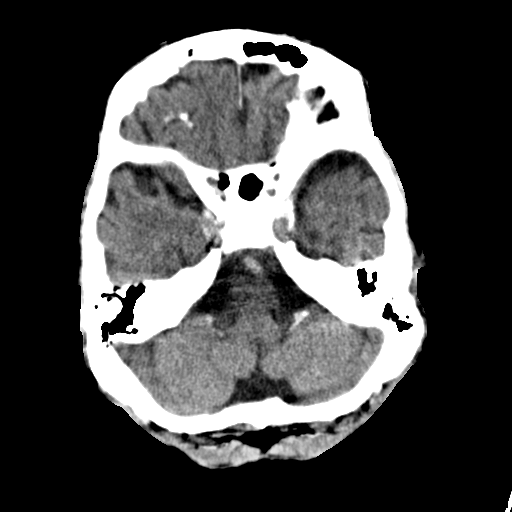
[im 8/34  brain]
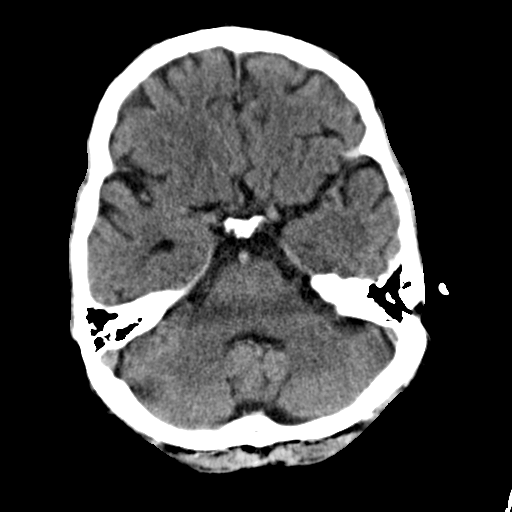
[im 11/34  brain]
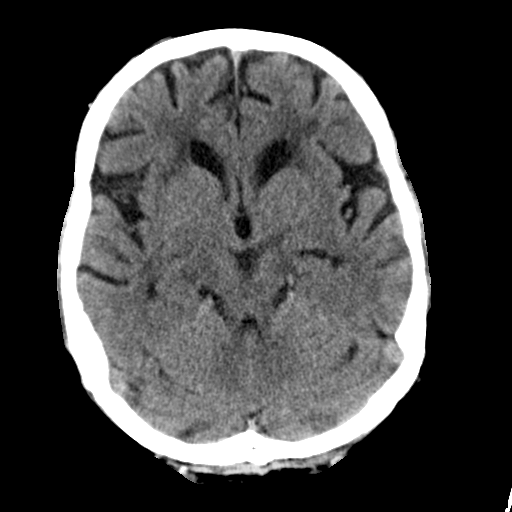
[im 11/34  bone]
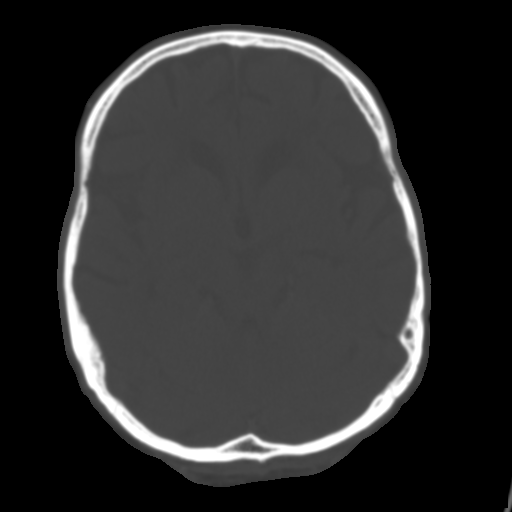
[im 13/34  brain]
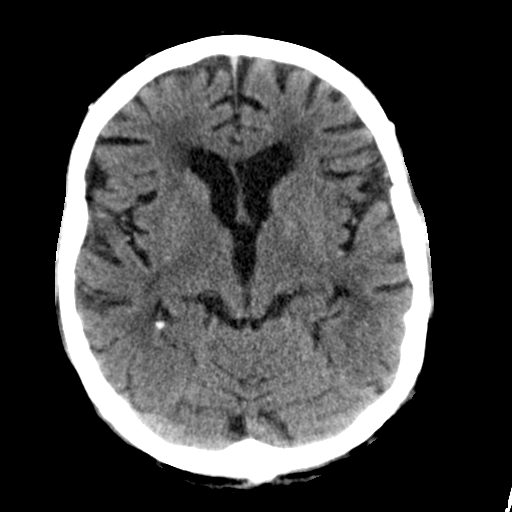
[im 15/34  brain]
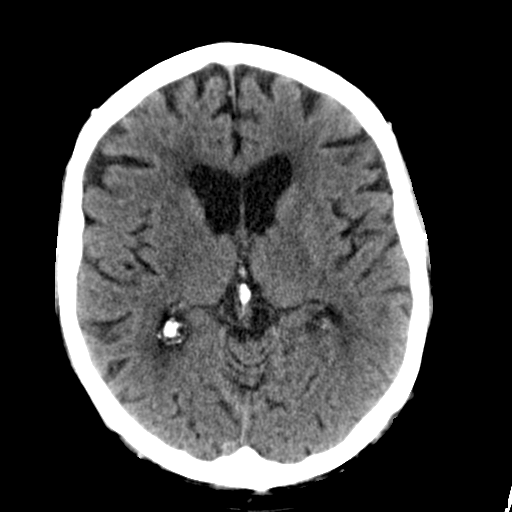
[im 18/34  brain]
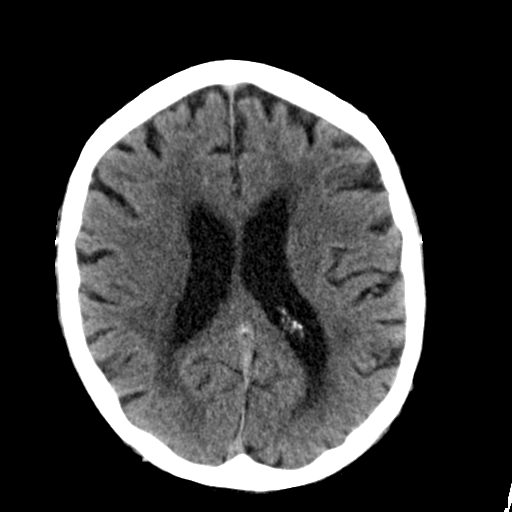
[im 19/34  brain]
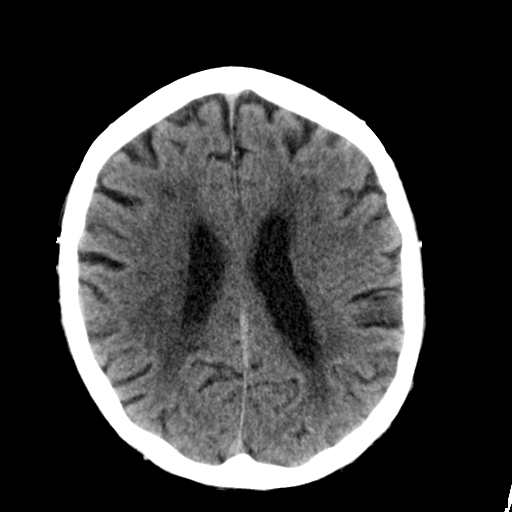
[im 19/34  bone]
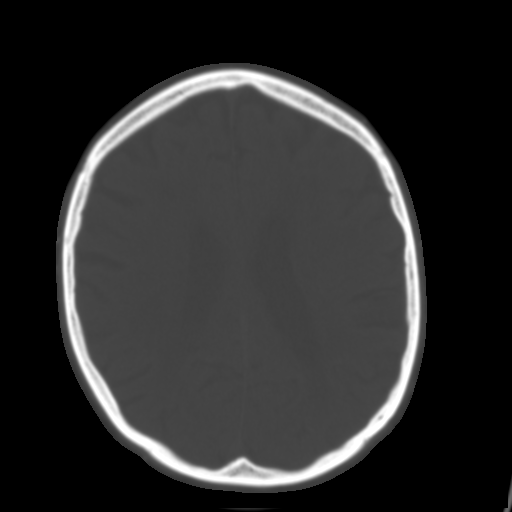
[im 21/34  brain]
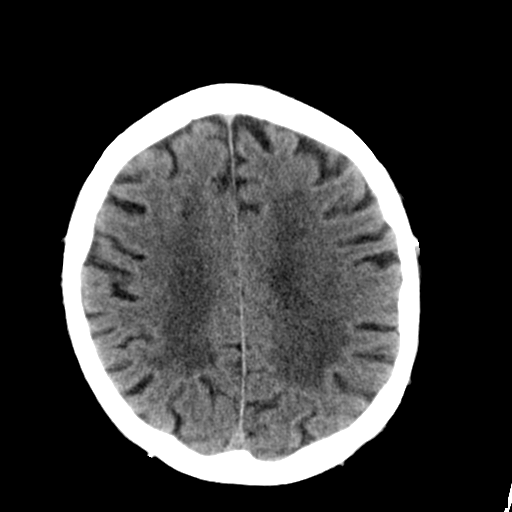
[im 23/34  brain]
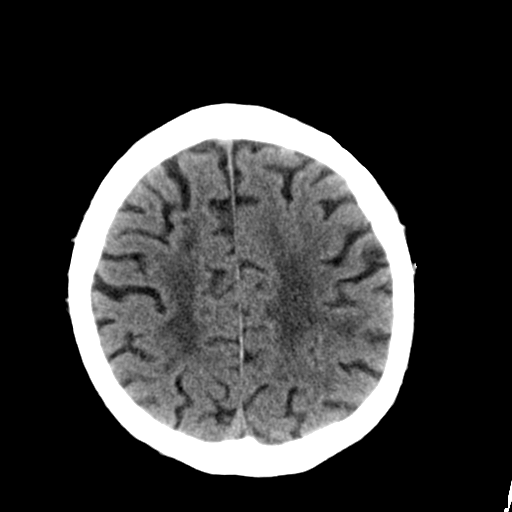
[im 26/34  brain]
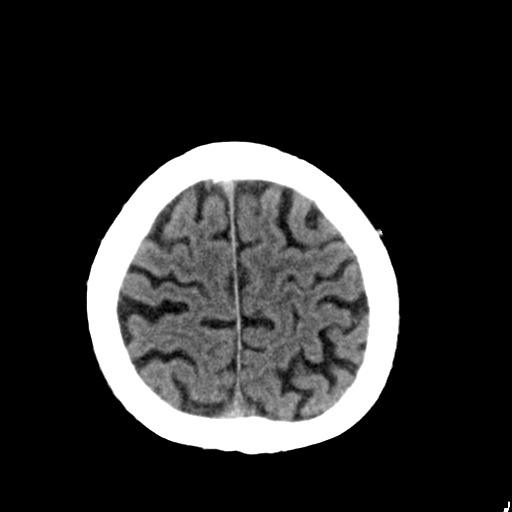
[im 28/34  brain]
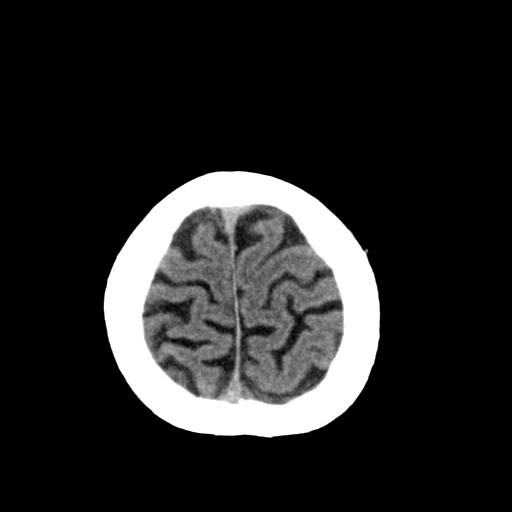
[im 28/34  bone]
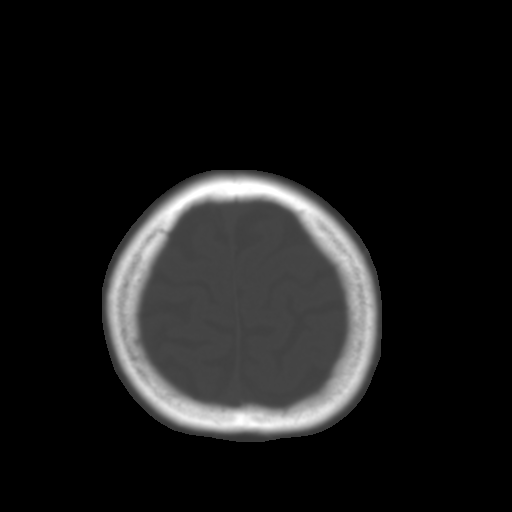
[im 30/34  brain]
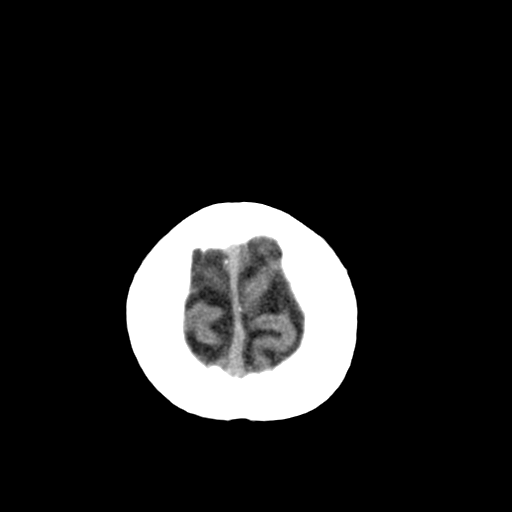
[im 32/34  brain]
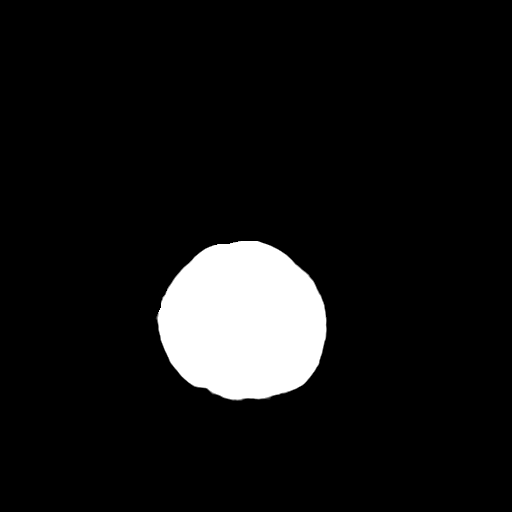

[15 of 30 positions shown; findings below may reference images not displayed]

FINDINGS: Skull and Sinuses:There is extensive mucosal thickening, with fluid
level, in the left sphenoid sinus. The remaining imaged paranasal
sinuses are essentially clear.

Orbits: Apparent increased density in the posterior globes
bilaterally is likely artifactual given the symmetry and history.

Brain: No evidence of acute abnormality, such as acute infarction,
hemorrhage, hydrocephalus, or mass lesion/mass effect. There is
generalized cerebral volume loss which is age appropriate. Chronic
small vessel ischemic white matter disease with patchy bilateral
cerebral white matter low density. Ischemic gliosis is confluent
around the lateral ventricles. Multi focal intracranial calcified
atherosclerosis.
IMPRESSION: 1. No acute intracranial findings.
2. Left sphenoid sinusitis.
3. Senescent changes include chronic small vessel disease and
generalized atrophy.

## 2014-09-26 ENCOUNTER — Other Ambulatory Visit: Payer: Self-pay | Admitting: *Deleted

## 2014-09-26 DIAGNOSIS — R768 Other specified abnormal immunological findings in serum: Secondary | ICD-10-CM

## 2014-09-27 ENCOUNTER — Other Ambulatory Visit: Payer: Self-pay

## 2014-12-20 ENCOUNTER — Inpatient Hospital Stay: Payer: Medicare Other

## 2015-03-14 ENCOUNTER — Other Ambulatory Visit: Payer: Self-pay

## 2015-06-27 ENCOUNTER — Other Ambulatory Visit: Payer: Self-pay

## 2015-06-27 ENCOUNTER — Ambulatory Visit: Payer: Self-pay | Admitting: Internal Medicine

## 2015-10-04 IMAGING — CR DG CHEST 1V PORT
1 series · 1 of 1 positions shown · non-contrast
Comparison: 11/19/2013

CLINICAL DATA: Code stroke.  Loss of consciousness.

EXAM:
PORTABLE CHEST - 1 VIEW

[ap]
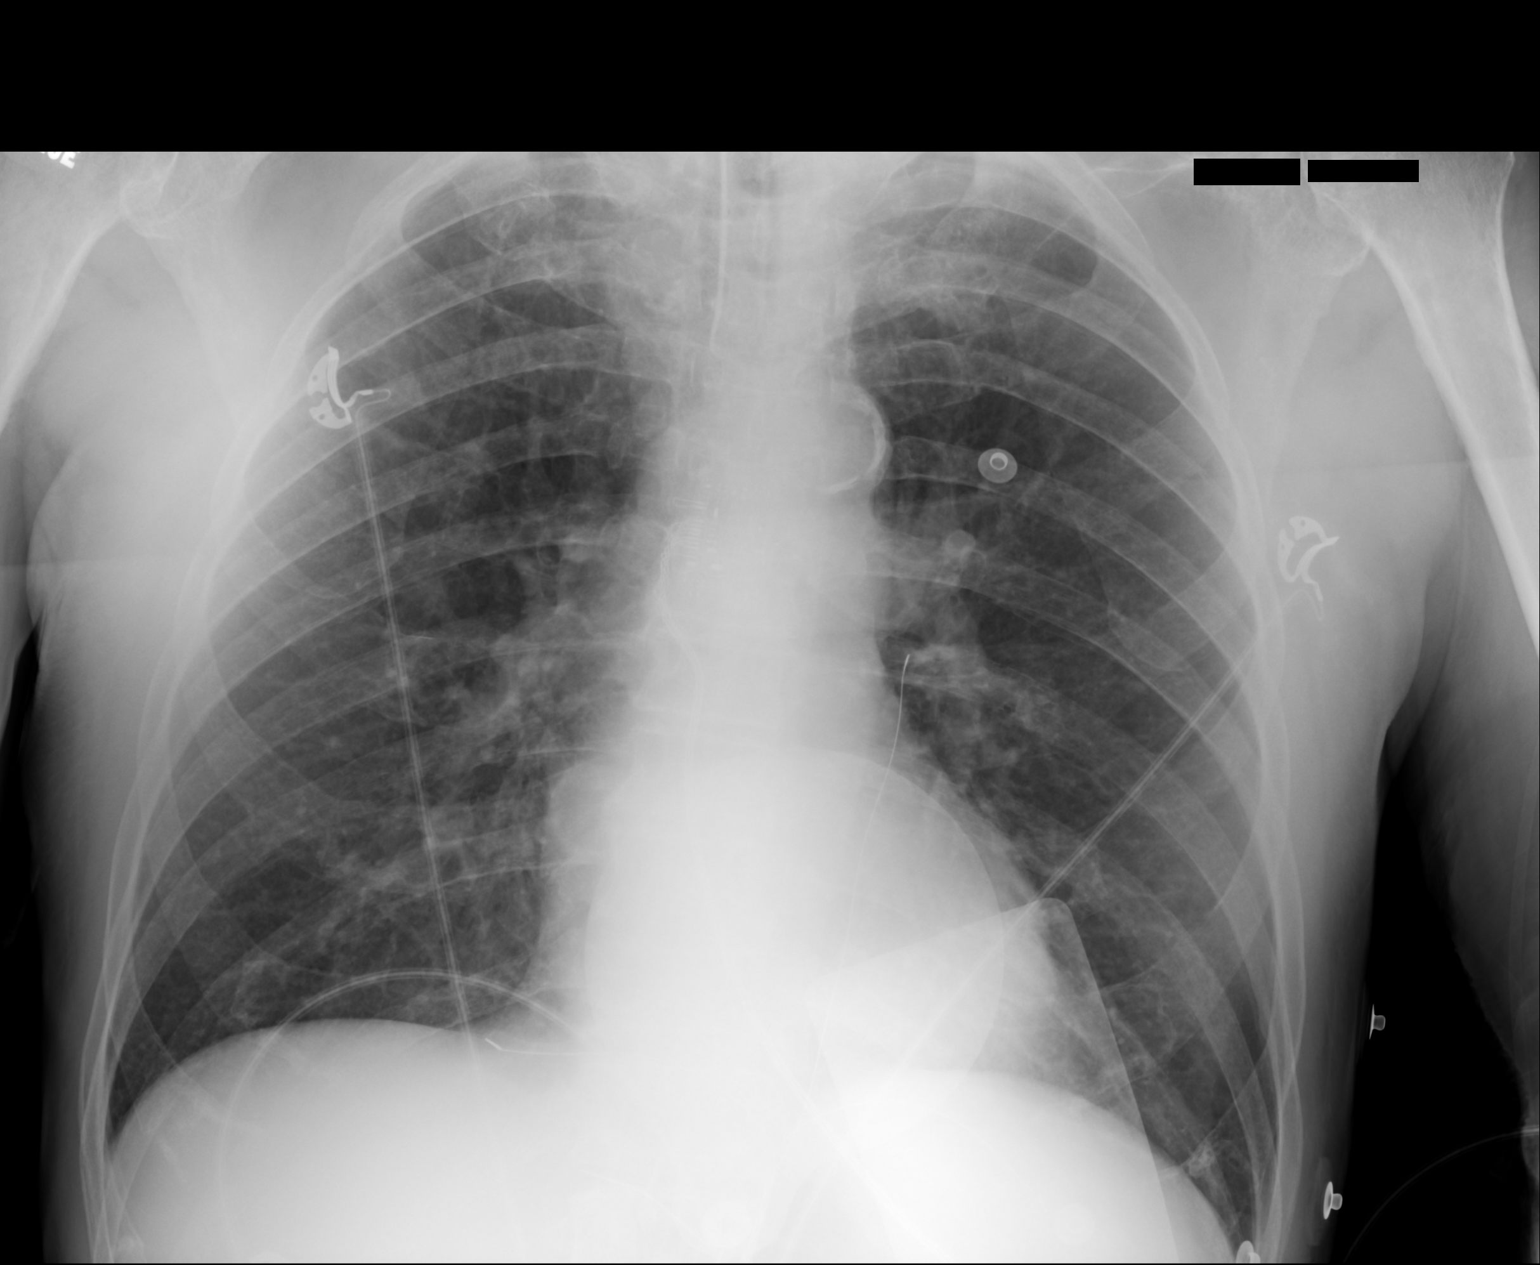

[1 of 1 positions shown; findings below may reference images not displayed]

FINDINGS: Endotracheal tube in good position at the level of the top of the
aortic arch. Heart size and pulmonary vascularity are normal. Lungs
are clear. No acute osseous abnormality.
IMPRESSION: Endotracheal tube in good position. Otherwise normal appearing
chest.
